# Patient Record
Sex: Male | Born: 1970 | Race: White | Hispanic: No | Marital: Married | Smoking: Never smoker
Health system: Southern US, Community
[De-identification: ages and names within clinical notes are randomized; demographics above are authoritative.]

## PROBLEM LIST (undated history)

## (undated) HISTORY — PX: NO PAST SURGERIES: SHX2092

---

## 2020-04-26 ENCOUNTER — Ambulatory Visit (HOSPITAL_COMMUNITY)
Admission: RE | Admit: 2020-04-26 | Discharge: 2020-04-26 | Disposition: A | Discharge status: Home / Self Care | Payer: BC Managed Care – PPO | Source: Ambulatory Visit | Attending: Nurse Practitioner | Admitting: Nurse Practitioner

## 2020-04-26 ENCOUNTER — Other Ambulatory Visit: Payer: Self-pay

## 2020-04-26 ENCOUNTER — Ambulatory Visit: Payer: BC Managed Care – PPO | Admitting: Nurse Practitioner

## 2020-04-26 ENCOUNTER — Encounter: Payer: Self-pay | Admitting: Nurse Practitioner

## 2020-04-26 VITALS — BP 136/86 | HR 62 | Temp 97.2°F | Resp 20 | Ht 73.0 in | Wt 203.0 lb

## 2020-04-26 DIAGNOSIS — S0990XD Unspecified injury of head, subsequent encounter: Secondary | ICD-10-CM | POA: Insufficient documentation

## 2020-04-26 DIAGNOSIS — S0990XA Unspecified injury of head, initial encounter: Secondary | ICD-10-CM | POA: Diagnosis not present
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
## 2020-04-26 NOTE — Patient Instructions (Signed)
Head Injury, Adult There are many types of head injuries. They can be as minor as a small bump. Some head injuries can be worse. Worse injuries include:  A strong hit to the head that shakes the brain back and forth, causing damage (concussion).  A bruise (contusion) of the brain. This means there is bleeding in the brain that can cause swelling.  A cracked skull (skull fracture).  Bleeding in the brain that gathers, gets thick (makes a clot), and forms a bump (hematoma). Most problems from a head injury come in the first 24 hours. However, you may still have side effects up to 7-10 days after your injury. It is important to watch your condition for any changes. You may need to be watched in the emergency department or urgent care, or you may need to stay in the hospital. What are the causes? There are many possible causes of a head injury. A serious head injury may be caused by:  A car accident.  Bicycle or motorcycle accidents.  Sports injuries.  Falls.  Being hit by an object. What are the signs or symptoms? Symptoms of a head injury include a bruise, bump, or bleeding where the injury happened. Other physical symptoms may include:  Headache.  Feeling like you may vomit (nauseous) or vomiting.  Dizziness.  Blurred or double vision.  Being uncomfortable around bright lights or loud noises.  Shaking movements that you cannot control (seizures).  Feeling tired.  Trouble being woken up.  Fainting or loss of consciousness. Mental or emotional symptoms may include:  Feeling grumpy or cranky.  Confusion and memory problems.  Having trouble paying attention or concentrating.  Changes in eating or sleeping habits.  Feeling worried or nervous (anxious).  Feeling sad (depressed). How is this treated? Treatment for this condition depends on how severe the injury is and the type of injury you have. The main goal is to prevent problems and to allow the brain time to  heal. Mild head injury If you have a mild head injury, you may be sent home, and treatment may include:  Being watched. A responsible adult should stay with you for 24 hours after your injury and check on you often.  Physical rest.  Brain rest.  Pain medicines. Severe head injury If you have a severe head injury, treatment may include:  Being watched closely. This includes staying in the hospital.  Medicines to: ? Help with pain. ? Prevent seizures. ? Help with brain swelling.  Protecting your airway and using a machine that helps you breathe (ventilator).  Treatments to watch for and manage swelling inside the brain.  Brain surgery. This may be needed to: ? Remove a collection of blood or blood clots. ? Stop the bleeding. ? Remove a part of the skull. This allows room for the brain to swell. Follow these instructions at home: Activity  Rest.  Avoid activities that are hard or tiring.  Make sure you get enough sleep.  Let your brain rest. Do this by limiting activities that need a lot of thought or attention, such as: ? Watching TV. ? Playing memory games and puzzles. ? Job-related work or homework. ? Working on the computer, social media, and texting.  Avoid activities that could cause another head injury until your doctor says it is okay. This includes playing sports. Having another head injury, especially before the first one has healed, can be dangerous.  Ask your doctor when it is safe for you to go back to   your normal activities, such as work or school. Ask your doctor for a step-by-step plan for slowly going back to your normal activities.  Ask your doctor when you can drive, ride a bicycle, or use heavy machinery. Do not do these activities if you are dizzy. Lifestyle  Do not drink alcohol until your doctor says it is okay.  Do not use drugs.  If it is harder than usual to remember things, write them down.  If you are easily distracted, try to do one  thing at a time.  Talk with family members or close friends when making important decisions.  Tell your friends, family, a trusted co-worker, and work manager about your injury, symptoms, and limits (restrictions). Have them watch for any problems that are new or getting worse.   General instructions  Take over-the-counter and prescription medicines only as told by your doctor.  Have someone stay with you for 24 hours after your head injury. This person should watch you for any changes in your symptoms and be ready to get help.  Keep all follow-up visits as told by your doctor. This is important. How is this prevented?  Work on your balance and strength. This can help you avoid falls.  Wear a seat belt when you are in a moving vehicle.  Wear a helmet when you: ? Ride a bicycle. ? Ski. ? Do any other sport or activity that has a risk of injury.  If you drink alcohol: ? Limit how much you use to:  0-1 drink a day for nonpregnant women.  0-2 drinks a day for men. ? Be aware of how much alcohol is in your drink. In the [place] one drink equals one 12 oz bottle of beer (355 mL), one 5 oz glass of wine (148 mL), or one 1 oz glass of hard liquor (44 mL).  Make your home safer by: ? Getting rid of clutter from the floors and stairs. This includes things that can make you trip. ? Using grab bars in bathrooms and handrails by stairs. ? Placing non-slip mats on floors and in bathtubs. ? Putting more light in dim areas. Where to find more information  Centers for Disease Control and Prevention: www.cdc.gov Get help right away if:  You have: ? A very bad headache that is not helped by medicine. ? Trouble walking or weakness in your arms and legs. ? Clear or bloody fluid coming from your nose or ears. ? Changes in how you see (vision). ? A seizure. ? More confusion or more grumpy moods.  Your symptoms get worse.  You are sleepier than normal and have trouble staying awake.  You  lose your balance.  The black centers of your eyes (pupils) change in size.  Your speech is slurred.  Your dizziness gets worse.  You vomit. These symptoms may be an emergency. Do not wait to see if the symptoms will go away. Get medical help right away. Call your local emergency services (911 in the [place] Do not drive yourself to the hospital. Summary  Head injuries can be as minor as a small bump. Some head injuries can be worse.  Treatment for this condition depends on how severe the injury is and the type of injury you have.  Have someone stay with you for 24 hours after your head injury.  Ask your doctor when it is safe for you to go back to your normal activities, such as work or school.  To prevent a head injury,   wear a seat belt in a car, wear a helmet when you use a bicycle, limit your alcohol use, and make your home safer. This information is not intended to replace advice given to you by your health care provider. Make sure you discuss any questions you have with your health care provider. Document Revised: 11/19/2018 Document Reviewed: 11/19/2018 Elsevier Patient Education  2021 [employer]vier [employer].  

## 2020-06-29 ENCOUNTER — Emergency Department (HOSPITAL_COMMUNITY): Payer: BC Managed Care – PPO

## 2020-06-29 ENCOUNTER — Encounter (HOSPITAL_COMMUNITY): Payer: Self-pay

## 2020-06-29 ENCOUNTER — Inpatient Hospital Stay (HOSPITAL_COMMUNITY)
Admission: EM | Admit: 2020-06-29 | Discharge: 2020-07-03 | DRG: 581 | Disposition: A | Discharge status: Home Health | Payer: BC Managed Care – PPO | Attending: Orthopaedic Surgery | Admitting: Orthopaedic Surgery

## 2020-06-29 DIAGNOSIS — S81811A Laceration without foreign body, right lower leg, initial encounter: Principal | ICD-10-CM | POA: Diagnosis present

## 2020-06-29 DIAGNOSIS — S81801A Unspecified open wound, right lower leg, initial encounter: Secondary | ICD-10-CM | POA: Diagnosis not present

## 2020-06-29 DIAGNOSIS — R52 Pain, unspecified: Secondary | ICD-10-CM | POA: Diagnosis present

## 2020-06-29 DIAGNOSIS — Z23 Encounter for immunization: Secondary | ICD-10-CM | POA: Diagnosis not present

## 2020-06-29 DIAGNOSIS — Z20822 Contact with and (suspected) exposure to covid-19: Secondary | ICD-10-CM | POA: Diagnosis present

## 2020-06-29 DIAGNOSIS — F419 Anxiety disorder, unspecified: Secondary | ICD-10-CM | POA: Diagnosis present

## 2020-06-29 DIAGNOSIS — Y92410 Unspecified street and highway as the place of occurrence of the external cause: Secondary | ICD-10-CM | POA: Diagnosis not present

## 2020-06-29 LAB — LACTIC ACID, PLASMA: Lactic Acid, Venous: 1.6 mmol/L (ref 0.5–1.9)

## 2020-06-29 LAB — BASIC METABOLIC PANEL
Anion gap: 9 (ref 5–15)
BUN: 17 mg/dL (ref 6–20)
CO2: 23 mmol/L (ref 22–32)
Calcium: 8.9 mg/dL (ref 8.9–10.3)
Chloride: 105 mmol/L (ref 98–111)
Creatinine, Ser: 1.15 mg/dL (ref 0.61–1.24)
GFR, Estimated: >60 mL/min (ref >60)
Glucose, Bld: 103 mg/dL — ABNORMAL HIGH (ref 70–99)
Potassium: 3.5 mmol/L (ref 3.5–5.1)
Sodium: 137 mmol/L (ref 135–145)

## 2020-06-29 LAB — CBC
HCT: 46.4 % (ref 39.0–52.0)
Hemoglobin: 15.8 g/dL (ref 13.0–17.0)
MCH: 31.9 pg (ref 26.0–34.0)
MCHC: 34.1 g/dL (ref 30.0–36.0)
MCV: 93.5 fL (ref 80.0–100.0)
Platelets: 220 10*3/uL (ref 150–400)
RBC: 4.96 MIL/uL (ref 4.22–5.81)
RDW: 12.8 % (ref 11.5–15.5)
WBC: 8.9 10*3/uL (ref 4.0–10.5)
nRBC: 0 % (ref 0.0–0.2)

## 2020-06-29 LAB — RESP PANEL BY RT-PCR (FLU A&B, COVID) ARPGX2
Influenza A by PCR: NEGATIVE
Influenza B by PCR: NEGATIVE
SARS Coronavirus 2 by RT PCR: NEGATIVE

## 2020-06-29 MED ORDER — OXYCODONE HCL 5 MG PO TABS: 5.0000 mg | ORAL_TABLET | ORAL | Status: DC | PRN

## 2020-06-29 MED ORDER — ONDANSETRON HCL 4 MG PO TABS: 4.0000 mg | ORAL_TABLET | Freq: Four times a day (QID) | ORAL | Status: DC | PRN

## 2020-06-29 MED ORDER — HYDROMORPHONE HCL 1 MG/ML IJ SOLN
INTRAMUSCULAR | Status: AC
  Administered 2020-06-29: 1 mg via INTRAVENOUS
  Filled 2020-06-29: qty 1

## 2020-06-29 MED ORDER — METHOCARBAMOL 500 MG PO TABS
500.0000 mg | ORAL_TABLET | Freq: Four times a day (QID) | ORAL | Status: DC | PRN
  Administered 2020-06-30: 500 mg via ORAL
  Filled 2020-06-29: qty 1

## 2020-06-29 MED ORDER — ONDANSETRON HCL 4 MG/2ML IJ SOLN
4.0000 mg | Freq: Once | INTRAMUSCULAR | Status: AC
  Administered 2020-06-29: 4 mg via INTRAVENOUS
  Filled 2020-06-29: qty 2

## 2020-06-29 MED ORDER — TETANUS-DIPHTH-ACELL PERTUSSIS 5-2.5-18.5 LF-MCG/0.5 IM SUSY
0.5000 mL | PREFILLED_SYRINGE | Freq: Once | INTRAMUSCULAR | Status: AC
  Administered 2020-06-29: 0.5 mL via INTRAMUSCULAR
  Filled 2020-06-29: qty 0.5

## 2020-06-29 MED ORDER — METOCLOPRAMIDE HCL 10 MG PO TABS
5.0000 mg | ORAL_TABLET | Freq: Three times a day (TID) | ORAL | Status: DC | PRN
  Filled 2020-06-29: qty 1

## 2020-06-29 MED ORDER — SORBITOL 70 % SOLN
30.0000 mL | Freq: Every day | Status: DC | PRN
  Filled 2020-06-29: qty 30

## 2020-06-29 MED ORDER — METOCLOPRAMIDE HCL 5 MG/ML IJ SOLN: 5.0000 mg | Freq: Three times a day (TID) | INTRAMUSCULAR | Status: DC | PRN

## 2020-06-29 MED ORDER — OXYCODONE HCL 5 MG PO TABS
10.0000 mg | ORAL_TABLET | ORAL | Status: DC | PRN
  Administered 2020-06-30: 15 mg via ORAL
  Administered 2020-06-30: 10 mg via ORAL
  Filled 2020-06-29: qty 3
  Filled 2020-06-29: qty 2

## 2020-06-29 MED ORDER — CEFAZOLIN SODIUM-DEXTROSE 2-4 GM/100ML-% IV SOLN
2.0000 g | Freq: Three times a day (TID) | INTRAVENOUS | Status: DC
  Administered 2020-06-30 – 2020-07-03 (×10): 2 g via INTRAVENOUS
  Filled 2020-06-29 (×16): qty 100

## 2020-06-29 MED ORDER — HYDROMORPHONE HCL 1 MG/ML IJ SOLN
1.0000 mg | Freq: Once | INTRAMUSCULAR | Status: AC
Start: 2020-06-29 — End: 2020-06-29

## 2020-06-29 MED ORDER — ACETAMINOPHEN 325 MG PO TABS: 325.0000 mg | ORAL_TABLET | Freq: Four times a day (QID) | ORAL | Status: DC | PRN

## 2020-06-29 MED ORDER — CEFAZOLIN SODIUM-DEXTROSE 2-4 GM/100ML-% IV SOLN
2.0000 g | Freq: Once | INTRAVENOUS | Status: AC
  Administered 2020-06-29: 2 g via INTRAVENOUS
  Filled 2020-06-29: qty 100

## 2020-06-29 MED ORDER — METHOCARBAMOL 1000 MG/10ML IJ SOLN
500.0000 mg | Freq: Four times a day (QID) | INTRAVENOUS | Status: DC | PRN
  Filled 2020-06-29 (×3): qty 5

## 2020-06-29 MED ORDER — ONDANSETRON HCL 4 MG/2ML IJ SOLN: 4.0000 mg | Freq: Four times a day (QID) | INTRAMUSCULAR | Status: DC | PRN

## 2020-06-29 MED ORDER — ACETAMINOPHEN 500 MG PO TABS
1000.0000 mg | ORAL_TABLET | Freq: Four times a day (QID) | ORAL | Status: DC
  Administered 2020-06-30: 1000 mg via ORAL
  Filled 2020-06-29: qty 2

## 2020-06-29 MED ORDER — DOCUSATE SODIUM 100 MG PO CAPS: 100.0000 mg | ORAL_CAPSULE | Freq: Two times a day (BID) | ORAL | Status: DC

## 2020-06-29 MED ORDER — DIPHENHYDRAMINE HCL 12.5 MG/5ML PO ELIX: 12.5000 mg | ORAL_SOLUTION | ORAL | Status: DC | PRN

## 2020-06-29 MED ORDER — LIDOCAINE-EPINEPHRINE (PF) 2 %-1:200000 IJ SOLN
20.0000 mL | Freq: Once | INTRAMUSCULAR | Status: AC
  Administered 2020-06-29: 20 mL
  Filled 2020-06-29: qty 20

## 2020-06-29 MED ORDER — SODIUM CHLORIDE 0.9 % IV SOLN: INTRAVENOUS | Status: DC

## 2020-06-29 MED ORDER — POLYETHYLENE GLYCOL 3350 17 G PO PACK: 17.0000 g | PACK | Freq: Every day | ORAL | Status: DC | PRN

## 2020-06-29 MED ORDER — MAGNESIUM CITRATE PO SOLN: 1.0000 | Freq: Once | ORAL | Status: DC | PRN

## 2020-06-29 MED ORDER — HYDROMORPHONE HCL 1 MG/ML IJ SOLN
0.5000 mg | INTRAMUSCULAR | Status: DC | PRN
  Administered 2020-06-30: 1 mg via INTRAVENOUS
  Filled 2020-06-29: qty 1

## 2020-06-29 MED ORDER — HYDROMORPHONE HCL 1 MG/ML IJ SOLN
1.0000 mg | Freq: Once | INTRAMUSCULAR | Status: AC
  Administered 2020-06-29: 1 mg via INTRAVENOUS
  Filled 2020-06-29: qty 1
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
## 2020-06-30 ENCOUNTER — Inpatient Hospital Stay (HOSPITAL_COMMUNITY): Payer: BC Managed Care – PPO | Admitting: Certified Registered Nurse Anesthetist

## 2020-06-30 ENCOUNTER — Encounter (HOSPITAL_COMMUNITY): Payer: Self-pay | Admitting: Orthopaedic Surgery

## 2020-06-30 ENCOUNTER — Encounter (HOSPITAL_COMMUNITY)
Admission: EM | Disposition: A | Discharge status: Home Health | Payer: Self-pay | Source: Home / Self Care | Attending: Orthopaedic Surgery

## 2020-06-30 DIAGNOSIS — S81801A Unspecified open wound, right lower leg, initial encounter: Secondary | ICD-10-CM

## 2020-06-30 HISTORY — PX: I & D EXTREMITY: SHX5045

## 2020-06-30 LAB — HIV ANTIBODY (ROUTINE TESTING W REFLEX): HIV Screen 4th Generation wRfx: NONREACTIVE

## 2020-06-30 SURGERY — IRRIGATION AND DEBRIDEMENT EXTREMITY
Anesthesia: General | Laterality: Right

## 2020-06-30 MED ORDER — OXYCODONE HCL 5 MG PO TABS: 10.0000 mg | ORAL_TABLET | ORAL | Status: DC | PRN

## 2020-06-30 MED ORDER — HYDROMORPHONE HCL 1 MG/ML IJ SOLN: 0.5000 mg | INTRAMUSCULAR | Status: DC | PRN

## 2020-06-30 MED ORDER — METOCLOPRAMIDE HCL 5 MG/ML IJ SOLN: 5.0000 mg | Freq: Three times a day (TID) | INTRAMUSCULAR | Status: DC | PRN

## 2020-06-30 MED ORDER — SODIUM CHLORIDE 0.9 % IV SOLN
INTRAVENOUS | Status: DC
  Administered 2020-06-30: 10 mL/h via INTRAVENOUS

## 2020-06-30 MED ORDER — OXYCODONE HCL 5 MG/5ML PO SOLN: 5.0000 mg | Freq: Once | ORAL | Status: DC | PRN

## 2020-06-30 MED ORDER — ACETAMINOPHEN 500 MG PO TABS
1000.0000 mg | ORAL_TABLET | Freq: Four times a day (QID) | ORAL | Status: AC
  Administered 2020-06-30 – 2020-07-01 (×4): 1000 mg via ORAL
  Filled 2020-06-30 (×4): qty 2

## 2020-06-30 MED ORDER — TAMSULOSIN HCL 0.4 MG PO CAPS
0.4000 mg | ORAL_CAPSULE | Freq: Every evening | ORAL | Status: DC
  Administered 2020-06-30 – 2020-07-02 (×3): 0.4 mg via ORAL
  Filled 2020-06-30 (×3): qty 1

## 2020-06-30 MED ORDER — FENTANYL CITRATE (PF) 100 MCG/2ML IJ SOLN
INTRAMUSCULAR | Status: DC | PRN
  Administered 2020-06-30: 50 ug via INTRAVENOUS
  Administered 2020-06-30: 25 ug via INTRAVENOUS

## 2020-06-30 MED ORDER — OXYCODONE HCL 5 MG PO TABS: 5.0000 mg | ORAL_TABLET | ORAL | Status: DC | PRN

## 2020-06-30 MED ORDER — MEPERIDINE HCL 25 MG/ML IJ SOLN
6.2500 mg | INTRAMUSCULAR | Status: DC | PRN
  Administered 2020-06-30 (×2): 6.25 mg via INTRAVENOUS

## 2020-06-30 MED ORDER — MIDAZOLAM HCL 2 MG/2ML IJ SOLN
INTRAMUSCULAR | Status: AC
  Filled 2020-06-30: qty 2

## 2020-06-30 MED ORDER — ONDANSETRON HCL 4 MG/2ML IJ SOLN: 4.0000 mg | Freq: Four times a day (QID) | INTRAMUSCULAR | Status: DC | PRN

## 2020-06-30 MED ORDER — MAGNESIUM CITRATE PO SOLN: 1.0000 | Freq: Once | ORAL | Status: DC | PRN

## 2020-06-30 MED ORDER — ASPIRIN 81 MG PO CHEW
81.0000 mg | CHEWABLE_TABLET | Freq: Every day | ORAL | Status: DC
  Administered 2020-06-30 – 2020-07-03 (×4): 81 mg via ORAL
  Filled 2020-06-30 (×4): qty 1

## 2020-06-30 MED ORDER — ONDANSETRON HCL 4 MG PO TABS: 4.0000 mg | ORAL_TABLET | Freq: Four times a day (QID) | ORAL | Status: DC | PRN

## 2020-06-30 MED ORDER — METOCLOPRAMIDE HCL 5 MG PO TABS: 5.0000 mg | ORAL_TABLET | Freq: Three times a day (TID) | ORAL | Status: DC | PRN

## 2020-06-30 MED ORDER — POLYETHYLENE GLYCOL 3350 17 G PO PACK: 17.0000 g | PACK | Freq: Every day | ORAL | Status: DC | PRN

## 2020-06-30 MED ORDER — CHLORHEXIDINE GLUCONATE 0.12 % MT SOLN
OROMUCOSAL | Status: AC
  Administered 2020-06-30: 15 mL via OROMUCOSAL
  Filled 2020-06-30: qty 15

## 2020-06-30 MED ORDER — CHLORHEXIDINE GLUCONATE 0.12 % MT SOLN: 15.0000 mL | Freq: Once | OROMUCOSAL | Status: AC

## 2020-06-30 MED ORDER — ACETAMINOPHEN 325 MG PO TABS
325.0000 mg | ORAL_TABLET | Freq: Four times a day (QID) | ORAL | Status: DC | PRN
  Administered 2020-07-01 – 2020-07-02 (×3): 650 mg via ORAL
  Filled 2020-06-30 (×3): qty 2

## 2020-06-30 MED ORDER — CEFAZOLIN SODIUM-DEXTROSE 2-4 GM/100ML-% IV SOLN: 2.0000 g | Freq: Three times a day (TID) | INTRAVENOUS | Status: DC

## 2020-06-30 MED ORDER — PROPOFOL 10 MG/ML IV BOLUS
INTRAVENOUS | Status: AC
  Filled 2020-06-30: qty 20

## 2020-06-30 MED ORDER — MIDAZOLAM HCL 2 MG/2ML IJ SOLN
INTRAMUSCULAR | Status: DC | PRN
  Administered 2020-06-30: 2 mg via INTRAVENOUS

## 2020-06-30 MED ORDER — DEXAMETHASONE SODIUM PHOSPHATE 10 MG/ML IJ SOLN
INTRAMUSCULAR | Status: AC
  Filled 2020-06-30: qty 1

## 2020-06-30 MED ORDER — OXYCODONE HCL 5 MG PO TABS: 5.0000 mg | ORAL_TABLET | Freq: Once | ORAL | Status: DC | PRN

## 2020-06-30 MED ORDER — DOCUSATE SODIUM 100 MG PO CAPS
100.0000 mg | ORAL_CAPSULE | Freq: Two times a day (BID) | ORAL | Status: DC
  Administered 2020-06-30 – 2020-07-02 (×4): 100 mg via ORAL
  Filled 2020-06-30 (×4): qty 1

## 2020-06-30 MED ORDER — LACTATED RINGERS IV SOLN: INTRAVENOUS | Status: DC

## 2020-06-30 MED ORDER — DIPHENHYDRAMINE HCL 12.5 MG/5ML PO ELIX: 25.0000 mg | ORAL_SOLUTION | ORAL | Status: DC | PRN

## 2020-06-30 MED ORDER — PROPOFOL 10 MG/ML IV BOLUS
INTRAVENOUS | Status: DC | PRN
  Administered 2020-06-30: 200 mg via INTRAVENOUS

## 2020-06-30 MED ORDER — DEXAMETHASONE SODIUM PHOSPHATE 10 MG/ML IJ SOLN
INTRAMUSCULAR | Status: DC | PRN
  Administered 2020-06-30: 8 mg via INTRAVENOUS

## 2020-06-30 MED ORDER — ONDANSETRON HCL 4 MG/2ML IJ SOLN
INTRAMUSCULAR | Status: DC | PRN
  Administered 2020-06-30: 4 mg via INTRAVENOUS

## 2020-06-30 MED ORDER — SODIUM CHLORIDE 0.9 % IR SOLN
Status: DC | PRN
  Administered 2020-06-30 (×2): 3000 mL

## 2020-06-30 MED ORDER — MEPERIDINE HCL 25 MG/ML IJ SOLN
INTRAMUSCULAR | Status: AC
  Filled 2020-06-30: qty 1

## 2020-06-30 MED ORDER — PROMETHAZINE HCL 25 MG/ML IJ SOLN: 6.2500 mg | INTRAMUSCULAR | Status: DC | PRN

## 2020-06-30 MED ORDER — HYDROMORPHONE HCL 1 MG/ML IJ SOLN
0.2500 mg | INTRAMUSCULAR | Status: DC | PRN
  Administered 2020-06-30 (×2): 0.25 mg via INTRAVENOUS
  Administered 2020-06-30: 0.5 mg via INTRAVENOUS

## 2020-06-30 MED ORDER — METHOCARBAMOL 500 MG PO TABS: 500.0000 mg | ORAL_TABLET | Freq: Four times a day (QID) | ORAL | Status: DC | PRN

## 2020-06-30 MED ORDER — PHENYLEPHRINE 40 MCG/ML (10ML) SYRINGE FOR IV PUSH (FOR BLOOD PRESSURE SUPPORT)
PREFILLED_SYRINGE | INTRAVENOUS | Status: AC
  Filled 2020-06-30: qty 10

## 2020-06-30 MED ORDER — ACETAMINOPHEN 325 MG PO TABS: 325.0000 mg | ORAL_TABLET | Freq: Four times a day (QID) | ORAL | Status: DC | PRN

## 2020-06-30 MED ORDER — MIDAZOLAM HCL 2 MG/2ML IJ SOLN: 0.5000 mg | Freq: Once | INTRAMUSCULAR | Status: DC | PRN

## 2020-06-30 MED ORDER — SORBITOL 70 % SOLN
30.0000 mL | Freq: Every day | Status: DC | PRN
  Filled 2020-06-30: qty 30

## 2020-06-30 MED ORDER — METHOCARBAMOL 1000 MG/10ML IJ SOLN
500.0000 mg | Freq: Four times a day (QID) | INTRAVENOUS | Status: DC | PRN
  Filled 2020-06-30: qty 5

## 2020-06-30 MED ORDER — HYDROMORPHONE HCL 1 MG/ML IJ SOLN
INTRAMUSCULAR | Status: AC
  Filled 2020-06-30: qty 1

## 2020-06-30 MED ORDER — FENTANYL CITRATE (PF) 250 MCG/5ML IJ SOLN
INTRAMUSCULAR | Status: AC
  Filled 2020-06-30: qty 5

## 2020-06-30 MED ORDER — ONDANSETRON HCL 4 MG/2ML IJ SOLN
INTRAMUSCULAR | Status: AC
  Filled 2020-06-30: qty 2

## 2020-06-30 MED ORDER — LIDOCAINE 2% (20 MG/ML) 5 ML SYRINGE
INTRAMUSCULAR | Status: DC | PRN
  Administered 2020-06-30: 20 mg via INTRAVENOUS

## 2020-06-30 SURGICAL SUPPLY — 42 items
BNDG ELASTIC 4X5.8 VLCR STR LF (GAUZE/BANDAGES/DRESSINGS) ×6 IMPLANT
BNDG ELASTIC 6X10 VLCR STRL LF (GAUZE/BANDAGES/DRESSINGS) ×3 IMPLANT
CANISTER WOUND CARE 500ML ATS (WOUND CARE) ×3 IMPLANT
COVER SURGICAL LIGHT HANDLE (MISCELLANEOUS) ×3 IMPLANT
DRAPE U-SHAPE 47X51 STRL (DRAPES) ×3 IMPLANT
DRESSING MEPILEX FLEX 4X4 (GAUZE/BANDAGES/DRESSINGS) ×1 IMPLANT
DRSG EMULSION OIL 3X3 NADH (GAUZE/BANDAGES/DRESSINGS) ×3 IMPLANT
DRSG MEPILEX FLEX 4X4 (GAUZE/BANDAGES/DRESSINGS) ×3
DRSG VAC ATS SM SENSATRAC (GAUZE/BANDAGES/DRESSINGS) ×3 IMPLANT
ELECT REM PT RETURN 9FT ADLT (ELECTROSURGICAL) ×3
ELECTRODE REM PT RTRN 9FT ADLT (ELECTROSURGICAL) ×1 IMPLANT
GAUZE SPONGE 4X4 12PLY STRL (GAUZE/BANDAGES/DRESSINGS) ×3 IMPLANT
GAUZE XEROFORM 5X9 LF (GAUZE/BANDAGES/DRESSINGS) ×3 IMPLANT
GLOVE ECLIPSE 7.0 STRL STRAW (GLOVE) ×3 IMPLANT
GLOVE SKINSENSE NS SZ7.5 (GLOVE) ×4
GLOVE SKINSENSE STRL SZ7.5 (GLOVE) ×2 IMPLANT
GLOVE SURG UNDER POLY LF SZ7 (GLOVE) ×3 IMPLANT
GOWN STRL REIN XL XLG (GOWN DISPOSABLE) ×6 IMPLANT
HANDPIECE INTERPULSE COAX TIP (DISPOSABLE) ×2
KIT BASIN OR (CUSTOM PROCEDURE TRAY) ×3 IMPLANT
KIT TURNOVER KIT B (KITS) ×3 IMPLANT
MANIFOLD NEPTUNE II (INSTRUMENTS) ×3 IMPLANT
PACK ORTHO EXTREMITY (CUSTOM PROCEDURE TRAY) ×3 IMPLANT
PAD ARMBOARD 7.5X6 YLW CONV (MISCELLANEOUS) ×6 IMPLANT
PAD CAST 4YDX4 CTTN HI CHSV (CAST SUPPLIES) ×3 IMPLANT
PADDING CAST ABS 4INX4YD NS (CAST SUPPLIES) ×2
PADDING CAST ABS COTTON 4X4 ST (CAST SUPPLIES) ×1 IMPLANT
PADDING CAST COTTON 4X4 STRL (CAST SUPPLIES) ×6
PADDING CAST COTTON 6X4 STRL (CAST SUPPLIES) ×3 IMPLANT
SET HNDPC FAN SPRY TIP SCT (DISPOSABLE) ×1 IMPLANT
SPLINT FIBERGLASS 4X30 (CAST SUPPLIES) ×3 IMPLANT
SPONGE LAP 18X18 RF (DISPOSABLE) ×3 IMPLANT
STOCKINETTE IMPERVIOUS 9X36 MD (GAUZE/BANDAGES/DRESSINGS) ×3 IMPLANT
SUT VIC AB 2-0 FS1 27 (SUTURE) ×3 IMPLANT
TOWEL GREEN STERILE (TOWEL DISPOSABLE) ×3 IMPLANT
TOWEL GREEN STERILE FF (TOWEL DISPOSABLE) ×3 IMPLANT
TRAY CATH INTERMITTENT SS 16FR (CATHETERS) ×3 IMPLANT
TUBE CONNECTING 12'X1/4 (SUCTIONS) ×1
TUBE CONNECTING 12X1/4 (SUCTIONS) ×2 IMPLANT
UNDERPAD 30X36 HEAVY ABSORB (UNDERPADS AND DIAPERS) ×3 IMPLANT
WATER STERILE IRR 1000ML POUR (IV SOLUTION) ×3 IMPLANT
YANKAUER SUCT BULB TIP NO VENT (SUCTIONS) ×3 IMPLANT

## 2020-06-30 NOTE — ED Notes (Signed)
All clothing and helmet sent with patient to OR.  1 gold colored  Ring, 1 black wallet with $81.00 and 1 visa card 2 cell phones locked up with security [phone]

## 2020-06-30 NOTE — Transfer of Care (Signed)
Immediate Anesthesia Transfer of Care Note  Patient: Troy Meza  Procedure(s) Performed: IRRIGATION AND DEBRIDEMENT; APPLICATION OF WOUND VAC, LEG (Right)  Patient Location: PACU  Anesthesia Type:General  Level of Consciousness: awake and alert   Airway & Oxygen Therapy: Patient Spontanous Breathing and Patient connected to face mask oxygen  Post-op Assessment: Report given to RN and Post -op Vital signs reviewed and stable  Post vital signs: Reviewed and stable  Last Vitals:  Vitals Value Taken Time  BP 139/76 06/30/20 0855  Temp 36.4 C 06/30/20 0855  Pulse 74 06/30/20 0857  Resp 10 06/30/20 0857  SpO2 98 % 06/30/20 0857  Vitals shown include unvalidated device data.  Last Pain:  Vitals:   06/30/20 0855  TempSrc:   PainSc: 7       Patients Stated Pain Goal: 4 (06/30/20 0855)  Complications: No notable events documented.

## 2020-06-30 NOTE — Anesthesia Procedure Notes (Signed)
Procedure Name: LMA Insertion Date/Time: 06/30/2020 7:44 AM Performed by: Epifanio Lesches, CRNA Pre-anesthesia Checklist: Patient identified, Emergency Drugs available, Suction available and Patient being monitored Patient Re-evaluated:Patient Re-evaluated prior to induction Oxygen Delivery Method: Circle System Utilized Preoxygenation: Pre-oxygenation with 100% oxygen Induction Type: IV induction Ventilation: Mask ventilation without difficulty LMA: LMA inserted LMA Size: 5.0 Number of attempts: 1 Placement Confirmation: positive ETCO2 and breath sounds checked- equal and bilateral Tube secured with: Tape Dental Injury: Teeth and Oropharynx as per pre-operative assessment

## 2020-06-30 NOTE — Anesthesia Preprocedure Evaluation (Addendum)
Anesthesia Evaluation  Patient identified by MRN, date of birth, ID band Patient awake    Reviewed: Allergy & Precautions, NPO status , Patient's Chart, lab work & pertinent test results  History of Anesthesia Complications Negative for: history of anesthetic complications  Airway Mallampati: II  TM Distance: >3 FB Neck ROM: Full    Dental  (+) Teeth Intact, Dental Advisory Given   Pulmonary neg pulmonary ROS,  06/29/2020 SARS coronavirus NEG   breath sounds clear to auscultation       Cardiovascular (-) anginanegative cardio ROS   Rhythm:Regular Rate:Normal     Neuro/Psych Anxiety negative neurological ROS     GI/Hepatic negative GI ROS, Neg liver ROS,   Endo/Other  negative endocrine ROS  Renal/GU negative Renal ROS     Musculoskeletal   Abdominal   Peds  Hematology negative hematology ROS (+)   Anesthesia Other Findings   Reproductive/Obstetrics                            Anesthesia Physical Anesthesia Plan  ASA: 2  Anesthesia Plan: General   Post-op Pain Management:    Induction: Intravenous  PONV Risk Score and Plan: 2 and Ondansetron and Dexamethasone  Airway Management Planned: LMA  Additional Equipment: None  Intra-op Plan:   Post-operative Plan:   Informed Consent: I have reviewed the patients History and Physical, chart, labs and discussed the procedure including the risks, benefits and alternatives for the proposed anesthesia with the patient or authorized representative who has indicated his/her understanding and acceptance.     Dental advisory given  Plan Discussed with: CRNA and Surgeon  Anesthesia Plan Comments:        Anesthesia Quick Evaluation
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
## 2020-06-30 NOTE — Anesthesia Postprocedure Evaluation (Signed)
Anesthesia Post Note  Patient: Troy Meza  Procedure(s) Performed: IRRIGATION AND DEBRIDEMENT; APPLICATION OF WOUND VAC, LEG (Right)     Patient location during evaluation: PACU Anesthesia Type: General Level of consciousness: awake and alert, patient cooperative and oriented Pain management: pain level controlled Vital Signs Assessment: post-procedure vital signs reviewed and stable Respiratory status: spontaneous breathing, nonlabored ventilation and respiratory function stable Cardiovascular status: blood pressure returned to baseline and stable Postop Assessment: no apparent nausea or vomiting Anesthetic complications: no   No notable events documented.  Last Vitals:  Vitals:   06/30/20 0910 06/30/20 0925  BP: 121/75 123/64  Pulse: 77 65  Resp: 18 15  Temp:    SpO2: 94% 96%    Last Pain:  Vitals:   06/30/20 0925  TempSrc:   PainSc: 3         RLE Motor Response: Purposeful movement;Responds to commands (06/30/20 0762) RLE Sensation: Full sensation (06/30/20 0925)      Erling Cruz. Sheree Lalla
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
## 2020-07-01 ENCOUNTER — Encounter (HOSPITAL_COMMUNITY): Payer: Self-pay | Admitting: Orthopaedic Surgery

## 2020-07-01 NOTE — Plan of Care (Signed)
?  Problem: Education: ?Goal: Knowledge of General Education information will improve ?Description: Including pain rating scale, medication(s)/side effects and non-pharmacologic comfort measures ?Outcome: Progressing ?  ?Problem: Health Behavior/Discharge Planning: ?Goal: Ability to manage health-related needs will improve ?Outcome: Progressing ?  ?Problem: Clinical Measurements: ?Goal: Ability to maintain clinical measurements within normal limits will improve ?Outcome: Progressing ?  ?Problem: Activity: ?Goal: Risk for activity intolerance will decrease ?Outcome: Progressing ?  ?Problem: Nutrition: ?Goal: Adequate nutrition will be maintained ?Outcome: Progressing ?  ?Problem: Elimination: ?Goal: Will not experience complications related to bowel motility ?Outcome: Progressing ?Goal: Will not experience complications related to urinary retention ?Outcome: Progressing ?  ?Problem: Pain Managment: ?Goal: General experience of comfort will improve ?Outcome: Progressing ?  ?Problem: Safety: ?Goal: Ability to remain free from injury will improve ?Outcome: Progressing ?  ?
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
## 2020-07-01 NOTE — Progress Notes (Signed)
Subjective: 1 Day Post-Op Procedure(s) (LRB): IRRIGATION AND DEBRIDEMENT; APPLICATION OF WOUND VAC, LEG (Right) Patient reports pain as moderate.    Objective: Vital signs in last 24 hours: Temp:  [97.3 F (36.3 C)-98.9 F (37.2 C)] 97.3 F (36.3 C) (06/11 2128) Pulse Rate:  [47-82] 63 (06/11 2128) Resp:  [12-19] 17 (06/11 2128) BP: (111-139)/(64-91) 118/74 (06/11 2128) SpO2:  [94 %-100 %] 98 % (06/11 2128)  Intake/Output from previous day: 06/11 0701 - 06/12 0700 In: 2001.7 [P.O.:600; I.V.:1195.6; IV Piggyback:206.1] Out: 3350 [Urine:3275; Drains:50; Blood:25] Intake/Output this shift: No intake/output data recorded.  Recent Labs    06/29/20 1924  HGB 15.8   Recent Labs    06/29/20 1924  WBC 8.9  RBC 4.96  HCT 46.4  PLT 220   Recent Labs    06/29/20 1924  NA 137  K 3.5  CL 105  CO2 23  BUN 17  CREATININE 1.15  GLUCOSE 103*  CALCIUM 8.9   No results for input(s): LABPT, INR in the last 72 hours.  Neurologically intact Neurovascular intact Sensation intact distally RLE splint in place Able to slightly dorsiflex big toe Wound vac in place and functioning with 50cc blood in canister    Assessment/Plan: 1 Day Post-Op Procedure(s) (LRB): IRRIGATION AND DEBRIDEMENT; APPLICATION OF WOUND VAC, LEG (Right) Up with therapy Continue with splint and elevation to RLE Continue with wound vac Wound vac changes on the floor m/w/f.  Will remove the splint prior to change       Cristie Hem 07/01/2020, 8:13 AM

## 2020-07-02 NOTE — Progress Notes (Signed)
Mahlik is doing well this morning.  Pain is significantly improved since initial presentation.  Ambulating safely with rolling walker.  VAC has small amount of drainage.  Toe and ankle dorsiflexion intact.  Plan is for floor vac change by wound nurse.  Will need case management to arrange for home vac unit and home health wound nurse for VAC changes.  Ortho tech to remove splint and place into CAM boot.  WBAT BLE.  Mobilize with PT.  Anticipate d/c home tomorrow.

## 2020-07-02 NOTE — Plan of Care (Signed)
  Problem: Education: Goal: Knowledge of General Education information will improve Description: Including pain rating scale, medication(s)/side effects and non-pharmacologic comfort measures Outcome: Progressing   Problem: Health Behavior/Discharge Planning: Goal: Ability to manage health-related needs will improve Outcome: Progressing   Problem: Clinical Measurements: Goal: Ability to maintain clinical measurements within normal limits will improve Outcome: Progressing   Problem: Activity: Goal: Risk for activity intolerance will decrease Outcome: Progressing   Problem: Nutrition: Goal: Adequate nutrition will be maintained Outcome: Progressing   Problem: Elimination: Goal: Will not experience complications related to bowel motility Outcome: Progressing Goal: Will not experience complications related to urinary retention Outcome: Progressing   Problem: Pain Managment: Goal: General experience of comfort will improve Outcome: Progressing   
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
## 2020-07-02 NOTE — Progress Notes (Signed)
Orthopedic Tech Progress Note Patient Details:  Troy Meza 11-Sep-1970 [id number]  Ortho Devices Type of Ortho Device: CAM walker Ortho Device/Splint Location: Left Lower Extremity Ortho Device/Splint Interventions: Ordered, Application, Adjustment   Post Interventions Patient Tolerated: Well Instructions Provided: Adjustment of device, Care of device, Poper ambulation with device  Gerald Stabs 07/02/2020, 4:34 PM

## 2020-07-03 ENCOUNTER — Telehealth: Payer: Self-pay
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
## 2020-07-03 NOTE — Telephone Encounter (Signed)
Patient would like letter emailed to [email or web address]
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
## 2020-07-03 NOTE — Plan of Care (Signed)
  Problem: Education: Goal: Knowledge of General Education information will improve Description: Including pain rating scale, medication(s)/side effects and non-pharmacologic comfort measures Outcome: Adequate for Discharge   Problem: Health Behavior/Discharge Planning: Goal: Ability to manage health-related needs will improve Outcome: Adequate for Discharge   Problem: Clinical Measurements: Goal: Ability to maintain clinical measurements within normal limits will improve Outcome: Adequate for Discharge Goal: Will remain free from infection Outcome: Adequate for Discharge Goal: Diagnostic test results will improve Outcome: Adequate for Discharge Goal: Respiratory complications will improve Outcome: Adequate for Discharge Goal: Cardiovascular complication will be avoided Outcome: Adequate for Discharge   Problem: Activity: Goal: Risk for activity intolerance will decrease Outcome: Adequate for Discharge   Problem: Nutrition: Goal: Adequate nutrition will be maintained Outcome: Adequate for Discharge   Problem: Coping: Goal: Level of anxiety will decrease Outcome: Adequate for Discharge   Problem: Elimination: Goal: Will not experience complications related to bowel motility Outcome: Adequate for Discharge Goal: Will not experience complications related to urinary retention Outcome: Adequate for Discharge   Problem: Pain Managment: Goal: General experience of comfort will improve Outcome: Adequate for Discharge   Problem: Safety: Goal: Ability to remain free from injury will improve Outcome: Adequate for Discharge   Problem: Skin Integrity: Goal: Risk for impaired skin integrity will decrease Outcome: Adequate for Discharge   Problem: Acute Rehab PT Goals(only PT should resolve) Goal: Patient Will Transfer Sit To/From Stand Outcome: Adequate for Discharge Goal: Pt Will Ambulate Outcome: Adequate for Discharge Goal: Pt Will Go Up/Down Stairs Outcome: Adequate for  Discharge   

## 2020-07-03 NOTE — Telephone Encounter (Signed)
Per Mardella Layman, patient needs note. Called patient to see what letter needs to say. No answer. Could not leave VM. VM not set up.
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
## 2020-07-03 NOTE — Progress Notes (Signed)
Subjective: 3 Days Post-Op Procedure(s) (LRB): IRRIGATION AND DEBRIDEMENT; APPLICATION OF WOUND VAC, LEG (Right) Patient reports pain as mild.    Objective: Vital signs in last 24 hours: Temp:  [97.6 F (36.4 C)-98.7 F (37.1 C)] 98.7 F (37.1 C) (06/14 0819) Pulse Rate:  [60-69] 69 (06/14 0819) Resp:  [16-17] 17 (06/14 0819) BP: (122-125)/(72-90) 125/72 (06/14 0819) SpO2:  [95 %-97 %] 95 % (06/14 0819)  Intake/Output from previous day: 06/13 0701 - 06/14 0700 In: 720 [P.O.:720] Out: 100 [Urine:100] Intake/Output this shift: No intake/output data recorded.  No results for input(s): HGB in the last 72 hours. No results for input(s): WBC, RBC, HCT, PLT in the last 72 hours. No results for input(s): NA, K, CL, CO2, BUN, CREATININE, GLUCOSE, CALCIUM in the last 72 hours. No results for input(s): LABPT, INR in the last 72 hours.  Neurologically intact Neurovascular intact Sensation intact distally Intact pulses distally Dorsiflexion/Plantar flexion intact Incision: dressing C/D/I No cellulitis present Compartment soft   Assessment/Plan: 3 Days Post-Op Procedure(s) (LRB): IRRIGATION AND DEBRIDEMENT; APPLICATION OF WOUND VAC, LEG (Right) Up with therapy Discharge home with home health once case management has arranged for home vac unit and home health wound nurse for VAC changes WBAT RLE in cam walker Patient taking tylenol only for pain.  Does not think he needs anything stronger.  Will call office after d/c if he changes his mind.       Cristie Hem 07/03/2020, 8:31 AM

## 2020-07-04 ENCOUNTER — Ambulatory Visit: Payer: BC Managed Care – PPO

## 2020-07-04 NOTE — Telephone Encounter (Signed)
Emailed

## 2020-07-06 ENCOUNTER — Other Ambulatory Visit: Payer: Self-pay

## 2020-07-06 ENCOUNTER — Encounter: Payer: Self-pay | Admitting: Orthopaedic Surgery

## 2020-07-06 ENCOUNTER — Ambulatory Visit (INDEPENDENT_AMBULATORY_CARE_PROVIDER_SITE_OTHER): Payer: BC Managed Care – PPO | Admitting: Orthopaedic Surgery

## 2020-07-06 DIAGNOSIS — S81801A Unspecified open wound, right lower leg, initial encounter: Secondary | ICD-10-CM | POA: Diagnosis not present

## 2020-07-06 NOTE — Progress Notes (Signed)
   Post-Op Visit Note   Patient: Troy Meza           Date of Birth: [birth year]           MRN: [id number] Visit Date: 07/06/2020 PCP: Bennie Pierini, FNP   Assessment & Plan:  Chief Complaint:  Chief Complaint  Patient presents with   Right Leg - Follow-up   Visit Diagnoses:  1. Open wound of right lower leg, initial encounter     Plan: Troy Meza comes in today for wound VAC change for the right leg wound.  He is slowly improving in terms of pain and discomfort.  The wound demonstrates some dark discoloration of the anterior portion of the wound bed with serous drainage.  The lateral half of the wound has excellent beefy red granulation tissue.  Wound VAC was changed today and the incision was covered with Adaptic and wound VAC sponges well and set to -125 mmHg.  He will return on Monday for wound VAC change with Autumn Forrest.  He will begin making arrangements for skin graft to be done up at [facility] which is close to where he lives in [place].  Follow-Up Instructions: Return in about 3 days (around 07/09/2020).   Orders:  No orders of the defined types were placed in this encounter.  No orders of the defined types were placed in this encounter.   Imaging: No results found.  PMFS History: Patient Active Problem List   Diagnosis Date Noted   Open wound of right lower leg 06/29/2020   History reviewed. No pertinent past medical history.  History reviewed. No pertinent family history.  Past Surgical History:  Procedure Laterality Date   I & D EXTREMITY Right 06/30/2020   Procedure: IRRIGATION AND DEBRIDEMENT; APPLICATION OF WOUND VAC, LEG;  Surgeon: Tarry Kos, MD;  Location: [facility] OR;  Service: Orthopedics;  Laterality: Right;   NO PAST SURGERIES     Social History   Occupational History   Not on file  Tobacco Use   Smoking status: Never   Smokeless tobacco: Never  Vaping Use   Vaping Use: Never used  Substance and Sexual Activity   Alcohol use: Yes     Alcohol/week: 7.0 standard drinks    Types: 7 Cans of beer per week    Comment: 1 beetr nightly   Drug use: Never   Sexual activity: Not on file

## 2020-07-09 ENCOUNTER — Telehealth: Payer: Self-pay | Admitting: Orthopaedic Surgery

## 2020-07-09 ENCOUNTER — Ambulatory Visit: Payer: BC Managed Care – PPO

## 2020-07-09 DIAGNOSIS — S81801A Unspecified open wound, right lower leg, initial encounter: Secondary | ICD-10-CM

## 2020-07-09 NOTE — Progress Notes (Signed)
Patient is in the office today for a wound vac change. He is s/p RLE I&D with Dr. Roda Shutters 06/30/20.  Dr. Lajoyce Corners was in to see the pt. Healthy granulation tissue, healing well and advised ok to reapply vac. Adaptic was cut to cover wound and stitches underneath vac sponge. Good suction, no leaks and pt feels well. Has a follow up appt for vac change on Wednesday with Dr. Roda Shutters. Ace bandage applied due to swelling of the foot and ankle. Pt was given rx for compression socks to wear over the vac dressing. Instruction provided on application and will call with any questions.   Layli Capshaw, RMA, [employer]
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
## 2020-07-09 NOTE — Telephone Encounter (Signed)
Pts wife Troy Meza called stating they are getting ready to start the process of moving Drs and she wanted to leave a fax number for Dr. Andrena Mews so we could send an urgent referral. Troy Meza stated the pt is going to finish coming to all his appts but still like to have the referral faxed. They would like a CB when this has been sent just to make sure it was marked as urgent.   [phone]

## 2020-07-10 ENCOUNTER — Other Ambulatory Visit: Payer: Self-pay

## 2020-07-10 NOTE — Telephone Encounter (Signed)
Please advise when sent.

## 2020-07-10 NOTE — Telephone Encounter (Signed)
Referral placed in chart  

## 2020-07-11 ENCOUNTER — Ambulatory Visit: Payer: BC Managed Care – PPO

## 2020-07-11 ENCOUNTER — Other Ambulatory Visit: Payer: Self-pay

## 2020-07-11 DIAGNOSIS — S81801A Unspecified open wound, right lower leg, initial encounter: Secondary | ICD-10-CM

## 2020-07-11 NOTE — Progress Notes (Signed)
Patient is s/p a RLE I&D with Dr. Roda Shutters 06/30/20. He is here for wound vac change. Patient ambulates in a fracture boot with a walker but is proud to show that he is weight bearing without the assistive device for several steps and says that he is using a cane at home. Wound is healing well, good granulation tissue and decreased swelling from last visit. Photo obtained for chart. Xeroform applied to abrasion to shin. Adaptic cut to fit the wound and then vac applied. Good suction without leaks. Patient has brought a XL vive compression sock with him today and this was applied for the pt. Advised that he may leave this intact until his follow up visit on Friday. Advised to call the office with any questions.  Amandeep Hogston, RMA, [employer]

## 2020-07-13 ENCOUNTER — Other Ambulatory Visit: Payer: Self-pay

## 2020-07-13 ENCOUNTER — Telehealth: Payer: Self-pay

## 2020-07-13 ENCOUNTER — Ambulatory Visit (INDEPENDENT_AMBULATORY_CARE_PROVIDER_SITE_OTHER): Payer: BC Managed Care – PPO | Admitting: Physician Assistant

## 2020-07-13 ENCOUNTER — Encounter: Payer: Self-pay | Admitting: Orthopaedic Surgery

## 2020-07-13 ENCOUNTER — Ambulatory Visit: Payer: BC Managed Care – PPO

## 2020-07-13 DIAGNOSIS — S81801D Unspecified open wound, right lower leg, subsequent encounter: Secondary | ICD-10-CM
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
## 2020-07-13 NOTE — Telephone Encounter (Signed)
General 07/11/2020  9:35 AM Samuella Cota, CMA - -  Note   Faxed referral to Dr Laray Anger at [phone], P [phone]

## 2020-07-13 NOTE — Telephone Encounter (Signed)
Faxed all OV Notes to Ortho in [place] to fax number [phone]

## 2020-07-13 NOTE — Telephone Encounter (Signed)
FYI

## 2020-07-16 ENCOUNTER — Ambulatory Visit (INDEPENDENT_AMBULATORY_CARE_PROVIDER_SITE_OTHER): Payer: BC Managed Care – PPO | Admitting: Orthopedic Surgery

## 2020-07-16 ENCOUNTER — Encounter: Payer: Self-pay | Admitting: Orthopedic Surgery

## 2020-07-16 DIAGNOSIS — S81801D Unspecified open wound, right lower leg, subsequent encounter: Secondary | ICD-10-CM
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
## 2020-07-27 ENCOUNTER — Telehealth: Payer: Self-pay

## 2020-07-27 NOTE — Telephone Encounter (Signed)
I called pt and advised that Dr. Lajoyce Corners received the photo of his wound and that there is a lot of hypergranulation tissue. He would like for the pt to wear the sock with direct contact to the wound and the sleeve during waking hours and just the sock at night. To change this daily. The hope is that the additional compression with the sleeve will decreased the hypergranulation tissue and allow the wound to contract and migrate epithelia tissue across the wound. The pt is currently being treated in [place] and said that he would like to continue to use the foam dressing under the sock until his appt with them on Wednesday to hear that doctors thoughts about this treatment and to make sure it is in line with what they want to do. I advised the pt that I would call to check in with him 08/07/20 when back in the office and that in the meanwhile to continue to send picture to Dr. Lajoyce Corners of his progress. I will hold this message and follow up as discussed above.
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
